# Patient Record
Sex: Female | Born: 1950 | Race: White | Hispanic: No | Marital: Married | Smoking: Former smoker
Health system: Southern US, Community
[De-identification: ages and names within clinical notes are randomized; demographics above are authoritative.]

---

## 2007-04-21 ENCOUNTER — Ambulatory Visit: Payer: Self-pay | Admitting: Unknown Physician Specialty

## 2007-09-10 LAB — HM COLONOSCOPY: HM Colonoscopy: NORMAL

## 2008-03-19 IMAGING — CR DG IVP HYPERTENSIVE
1 series · 8 of 10 positions shown · non-contrast
Comparison: none

REASON FOR EXAM: UTI  hematuria
COMMENTS:

[Series 1: view not recorded · 0.17mm/px · 8 of 11 slices shown]
[im 1/11]
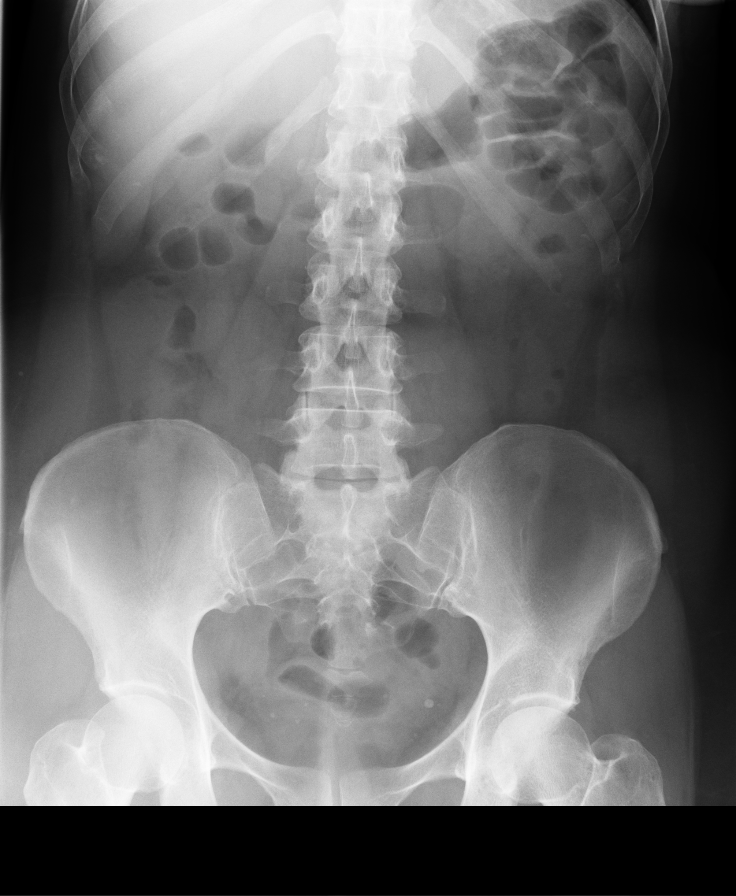
[im 2/11]
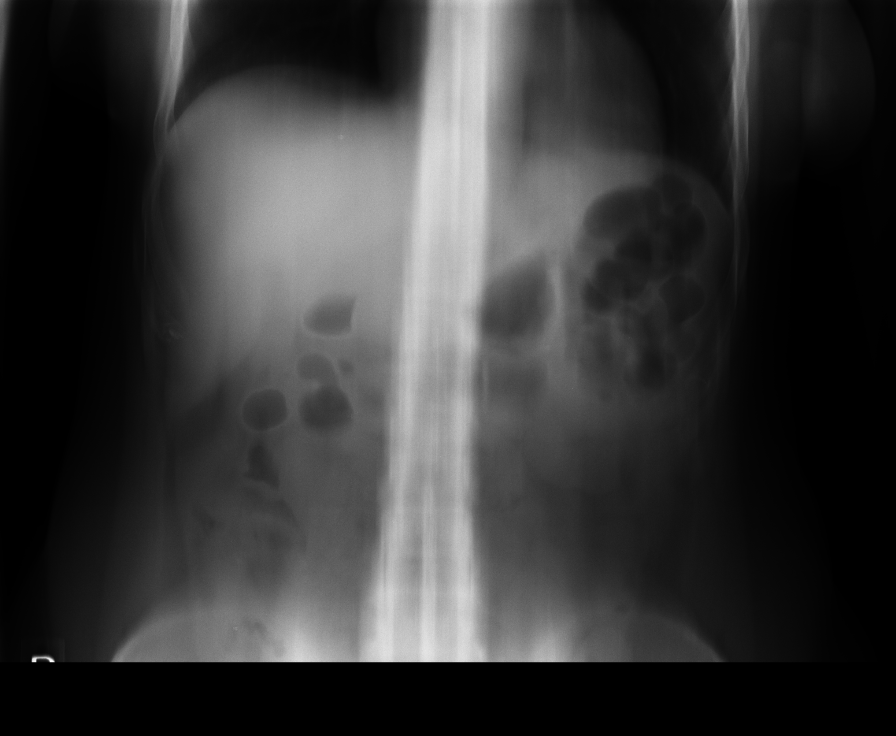
[im 3/11]
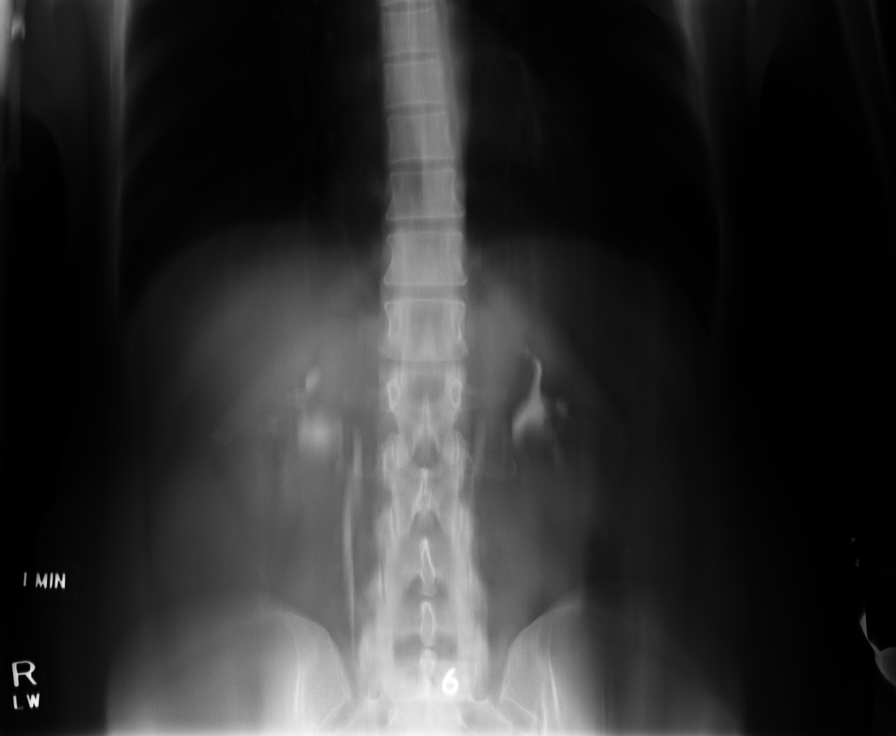
[im 4/11]
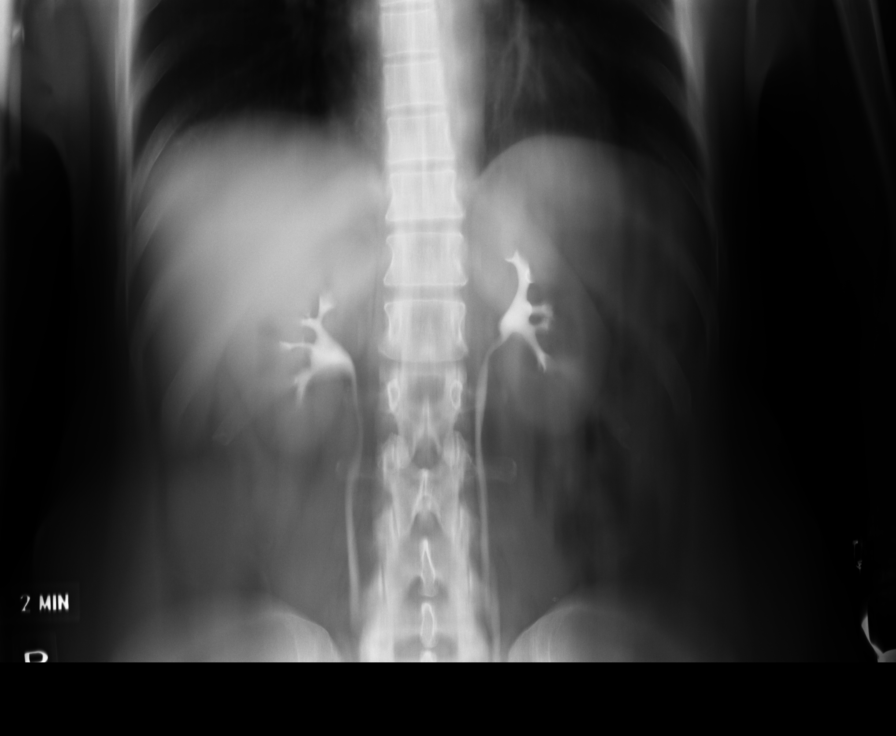
[im 5/11]
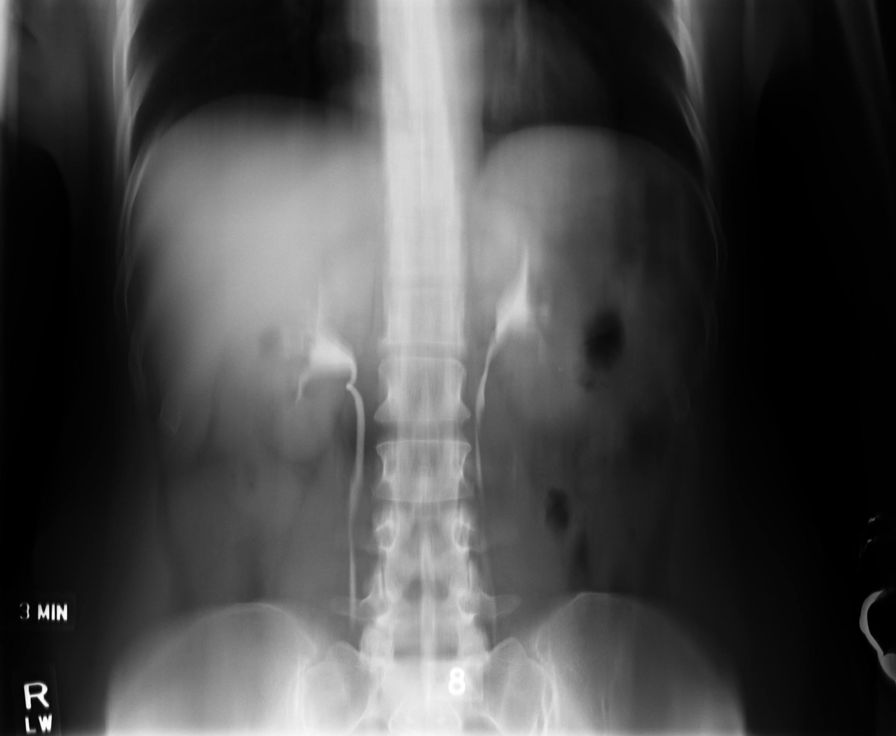
[im 6/11]
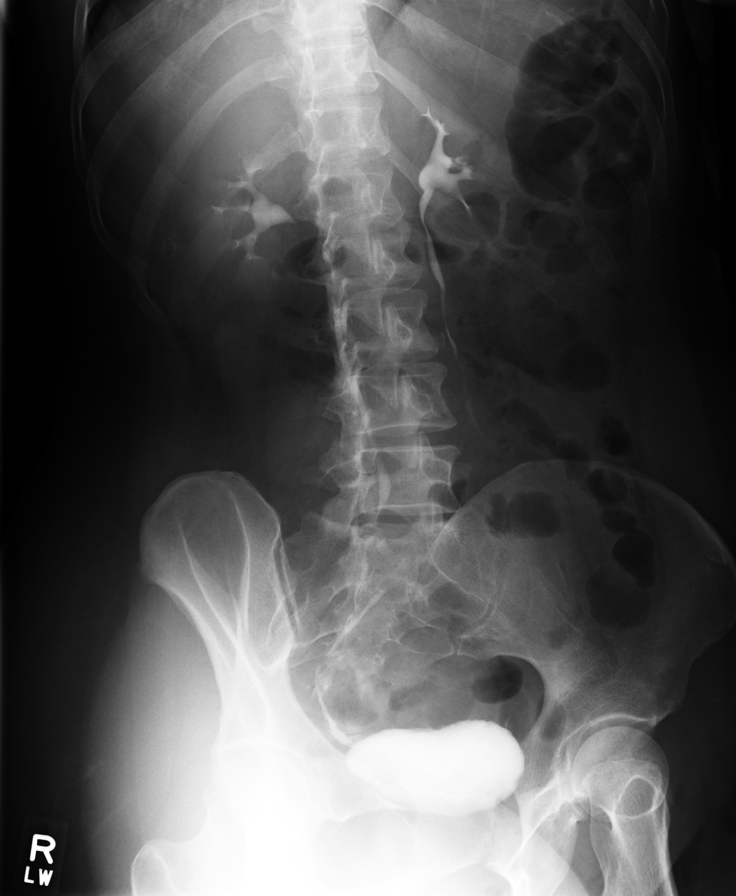
[im 7/11]
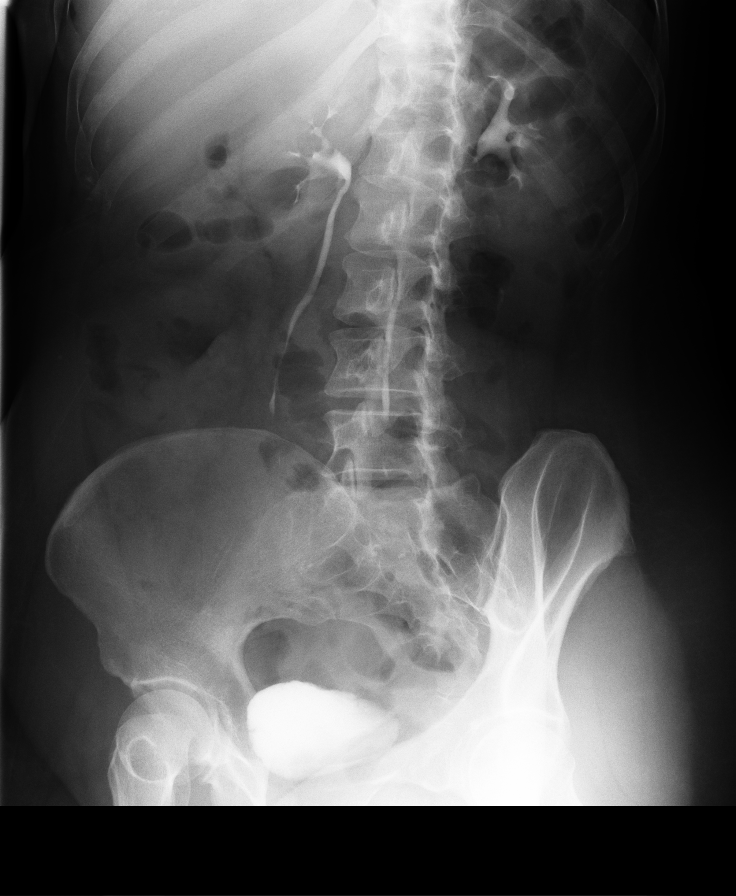
[im 8/11]
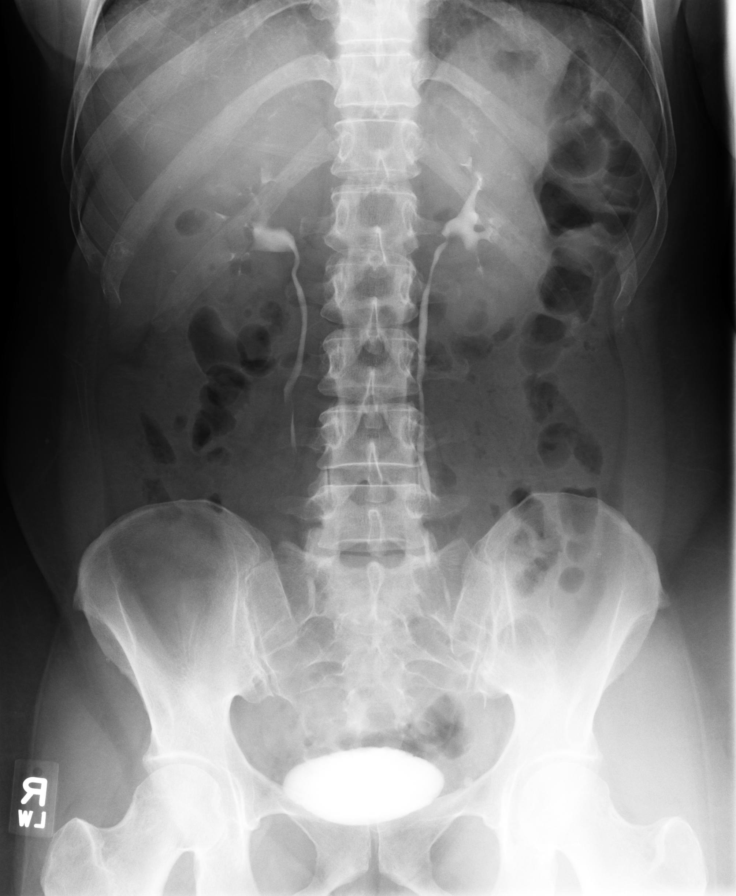

[8 of 10 positions shown; findings below may reference images not displayed]

PROCEDURE:     DXR - DXR INTRAVENOUS UROGRAPHY (IVP)  - April 21, 2007  [DATE]

RESULT:     The scout views demonstrate no definite radiopacity to suggest a
renal stone. Multiple calcific densities are seen in the pelvic region.
These have an appearance suggestive of phleboliths. The patient received 50
ml of iodinated contrast. Tomographic images show excretion of contrast on
the first image bilaterally with no obstruction evident. The kidneys are
slightly small. The LEFT kidney and RIGHT kidney show no definite defect or
extrinsic mass. No filling defect is seen in the ureter or in the renal
pelvis. There is no abnormal distention of the ureters or renal pelvis. The
bladder fills without a significant defect. A postvoid film shows no
significant postvoid residual volume.
IMPRESSION: Unremarkable IVP. No focal renal mass. No definite stones evident. No
obstructive changes present.

## 2011-10-11 LAB — HM DEXA SCAN

## 2012-06-09 ENCOUNTER — Encounter: Payer: Self-pay | Admitting: Internal Medicine

## 2012-06-09 ENCOUNTER — Ambulatory Visit (INDEPENDENT_AMBULATORY_CARE_PROVIDER_SITE_OTHER): Payer: BC Managed Care – PPO | Admitting: Internal Medicine

## 2012-06-09 VITALS — BP 124/72 | HR 74 | Temp 98.1°F | Resp 16 | Ht 62.0 in | Wt 140.8 lb

## 2012-06-09 DIAGNOSIS — E559 Vitamin D deficiency, unspecified: Secondary | ICD-10-CM | POA: Insufficient documentation

## 2012-06-09 DIAGNOSIS — M810 Age-related osteoporosis without current pathological fracture: Secondary | ICD-10-CM

## 2012-06-09 DIAGNOSIS — N895 Stricture and atresia of vagina: Secondary | ICD-10-CM

## 2012-06-09 DIAGNOSIS — E509 Vitamin A deficiency, unspecified: Secondary | ICD-10-CM | POA: Insufficient documentation

## 2012-06-09 DIAGNOSIS — E039 Hypothyroidism, unspecified: Secondary | ICD-10-CM

## 2012-06-09 DIAGNOSIS — E785 Hyperlipidemia, unspecified: Secondary | ICD-10-CM | POA: Insufficient documentation

## 2012-06-09 LAB — LIPID PANEL
HDL: 76 mg/dL (ref >39.00)
Total CHOL/HDL Ratio: 3
VLDL: 18.8 mg/dL (ref 0.0–40.0)

## 2012-06-09 LAB — COMPREHENSIVE METABOLIC PANEL
AST: 22 U/L (ref 0–37)
Albumin: 4.1 g/dL (ref 3.5–5.2)
Alkaline Phosphatase: 69 U/L (ref 39–117)
Calcium: 9.4 mg/dL (ref 8.4–10.5)
Chloride: 103 mEq/L (ref 96–112)
Glucose, Bld: 101 mg/dL — ABNORMAL HIGH (ref 70–99)
Potassium: 4 mEq/L (ref 3.5–5.1)
Sodium: 139 mEq/L (ref 135–145)
Total Protein: 7.1 g/dL (ref 6.0–8.3)

## 2012-06-09 LAB — TSH: TSH: 1.93 u[IU]/mL (ref 0.35–5.50)

## 2012-06-09 MED ORDER — NITROFURANTOIN MONOHYD MACRO 100 MG PO CAPS: ORAL_CAPSULE | ORAL | Status: DC

## 2012-06-09 MED ORDER — METH-HYO-M BL-BENZ ACD-PH SAL 81.6 MG PO TABS: ORAL_TABLET | ORAL | Status: DC

## 2012-06-09 NOTE — Assessment & Plan Note (Signed)
She is not interested in medication as treatment , but prefers to manage with exercise and calcium and vitamin d

## 2012-06-09 NOTE — Progress Notes (Signed)
Patient ID: Frances Johnson, female   DOB: 1951/02/08, 61 y.o.   MRN: [id number]  Patient Active Problem List  Diagnosis  . Osteoporosis, post-menopausal  . Hyperlipidemia LDL goal < 130  . Hypovitaminosis D  . Hypovitaminosis A  . Vaginal stenosis    Subjective:  CC:   Chief Complaint  Patient presents with  . New Patient    HPI:   Frances Johnson is a 61 y.o. female who presents as a new patient to establish primary care with the chief complaint of New patient.  She is a healthy  61 yr old white female  With a history of osteoporosis by DEXA.  She has no history of fractures and is physically active . She is a Chiropodist.  And commutes daily to [place].  She is happy in her homelife and job but is stressed  by the commute. Has to get up for 4:30 to beat the traffic, sleeps for a half hour in her car in the parking lot.  Leaves at 3:00 pm .  Cc is hoarseness:  It occurred as a result of her last major illness secondary to strep pharyngitis one year ago which was treated with prednisone. Her voice ha been hoarse since,  She has been working with Dr. Ladona Ridgel and his speech pathologisit to heal vocal folds.  The speech pathologist is [employer].   Takes a variety of vitaminss and minerals per Cleotis Lema.  Mild hyperlipidemia but HDL is very high also.  Has stopped walking daily because of extracurricular  Activities; she is in the process of house renovation with husband since February. Plans to resume it now,  30 minutes 5 times per week.  2) Dyspareunia:  Pelvic exam  last year by [facility] was normal. Resolved with use of Replens, but after a 6 month period of Replens unavailability, when she resumed it , it stopped working.  Then tried Estring, which she discontinued last December due to concerns about exogenous estrogen.  Wants to try vaginal dilators found on website called vaginismus. Com .  Had a bladder infection while using them. Husband has a very large penis and  she has a very small vagina.  Never had a problem when they were younger,  Not a lubrication issue.  Feels it is a stenosis .  Has decided to wait until house renovation is finished, now using replens every day in preparation for the future.    History reviewed. No pertinent past medical history.  History reviewed. No pertinent past surgical history.  Family History  Problem Relation Age of Onset  . Hyperlipidemia Mother   . Heart disease Mother   . Osteoporosis Mother   . Mental illness Mother     multi infarct dementia    History   Social History  . Marital Status: Married    Spouse Name: N/A    Number of Children: N/A  . Years of Education: N/A   Occupational History  . Not on file.   Social History Main Topics  . Smoking status: Former Smoker    Types: Cigarettes    Quit date: 06/09/1982  . Smokeless tobacco: Never Used  . Alcohol Use: Yes  . Drug Use: No  . Sexually Active: Not on file   Other Topics Concern  . Not on file   Social History Narrative  . No narrative on file    Allergies  Allergen Reactions  . Keflex (Cephalexin)      Review of  Systems:   Review of Systems  Constitutional: Negative.  Negative for chills and malaise/fatigue.  HENT: Negative for congestion and sore throat.   Eyes: Negative.   Respiratory: Negative.  Negative for cough, shortness of breath and wheezing.   Cardiovascular: Negative for chest pain and claudication.  Gastrointestinal: Negative for heartburn and blood in stool.  Genitourinary: Negative for dysuria and frequency.  Musculoskeletal: Negative for myalgias.  Skin: Negative for rash.  Neurological: Negative for dizziness, speech change and headaches.  Psychiatric/Behavioral: Negative for depression and hallucinations. The patient is not nervous/anxious.       Objective:  BP 124/72  Pulse 74  Temp 98.1 F (36.7 C) (Oral)  Resp 16  Ht 5\' 2"  (1.575 m)  Wt 140 lb 12 oz (63.844 kg)  BMI 25.74 kg/m2  SpO2  97%  General appearance: alert, cooperative and appears stated age Ears: normal TM's and external ear canals both ears Throat: lips, mucosa, and tongue normal; teeth and gums normal Neck: no adenopathy, no carotid bruit, supple, symmetrical, trachea midline and thyroid not enlarged, symmetric, no tenderness/mass/nodules Back: symmetric, no curvature. ROM normal. No CVA tenderness. Lungs: clear to auscultation bilaterally Heart: regular rate and rhythm, S1, S2 normal, no murmur, click, rub or gallop Abdomen: soft, non-tender; bowel sounds normal; no masses,  no organomegaly Pulses: 2+ and symmetric Skin: Skin color, texture, turgor normal. No rashes or lesions Lymph nodes: Cervical, supraclavicular, and axillary nodes normal.  Assessment and Plan:  Hypovitaminosis A She has been Taking massive amounts daily of vitamin A and needs her  level checked.   Hypovitaminosis D She has been taking 2000 units daily for history of low vitamin d in the setting of osteoporosis and has had not a repeat level checked in over a year.   Hyperlipidemia LDL goal < 130 LDL is 135,  HDL 76.  There is no indication for lipids.   Osteoporosis, post-menopausal She is not interested in medication as treatment , but prefers to manage with exercise and calcium and vitamin d  Vaginal stenosis Managed with Replens and vaginal dilators per patient preference.    Updated Medication List Outpatient Encounter Prescriptions as of 06/09/2012  Medication Sig Dispense Refill  . Ascorbic Acid (VITAMIN C) 1000 MG tablet Take 1,250 mg by mouth daily.      . beta carotene 16109 UNIT capsule Take 35,000 Units by mouth daily.      Marland Kitchen BORON PO Take 3.2 mg by mouth.      . calcium carbonate 1250 MG capsule Take 1,250 mg by mouth 2 (two) times daily with a meal.      . cholecalciferol (VITAMIN D) 1000 UNITS tablet Take 1,800 Units by mouth daily.      . fish oil-omega-3 fatty acids 1000 MG capsule Take 2 g by mouth daily.       . folic acid (FOLVITE) 1 MG tablet Take 2 mg by mouth daily.      [place] Chelate-Vit D (FOSTEUM PO) Take by mouth 2 (two) times daily.      . Glucosamine-Chondroit-Vit C-Mn (GLUCOSAMINE 1500 COMPLEX PO) Take by mouth.      . levothyroxine (SYNTHROID, LEVOTHROID) 88 MCG tablet Take 88 mcg by mouth daily.      . magnesium oxide (MAG-OX) 400 MG tablet Take 800 mg by mouth daily.      Marland Kitchen MANGANESE PO Take 17 mg by mouth.      . Meth-Hyo-M Bl-Benz Acd-Ph Sal (HYOPHEN PO) Take by mouth.      Marland Kitchen  Meth-Hyo-M Bl-Benz Acd-Ph Sal 81.6 MG TABS Take as needed for urinary complaints  120 each  0  . niacin 500 MG tablet Take 500 mg by mouth daily with breakfast.      . nitrofurantoin, macrocrystal-monohydrate, (MACROBID) 100 MG capsule A capsule after intercourse Keep on file for future refills  30 capsule  1  . Selenium 200 MCG CAPS Take by mouth.      . vitamin E 100 UNIT capsule Take 800 Units by mouth daily.      . vitamin k 100 MCG tablet Take 500 mcg by mouth daily.      . Zinc 50 MG TABS Take 65 mg by mouth.         Orders Placed This Encounter  Procedures  . HM MAMMOGRAPHY  . HM DEXA SCAN  . HM PAP SMEAR  . Lipid panel  . TSH  . Comprehensive metabolic panel  . Vitamin D (25 hydroxy)  . Vitamin A  . LDL cholesterol, direct    No Follow-up on file.

## 2012-06-09 NOTE — Assessment & Plan Note (Signed)
Managed with Replens and vaginal dilators per patient preference.

## 2012-06-09 NOTE — Assessment & Plan Note (Addendum)
She has been Taking massive amounts daily of vitamin A and needs her  level checked.

## 2012-06-09 NOTE — Assessment & Plan Note (Addendum)
LDL is 135,  HDL 76.  There is no indication for lipids.

## 2012-06-09 NOTE — Assessment & Plan Note (Signed)
She has been taking 2000 units daily for history of low vitamin d in the setting of osteoporosis and has had not a repeat level checked in over a year.

## 2012-06-11 ENCOUNTER — Telehealth: Payer: Self-pay | Admitting: Internal Medicine

## 2012-06-11 DIAGNOSIS — E039 Hypothyroidism, unspecified: Secondary | ICD-10-CM

## 2012-06-11 MED ORDER — LEVOTHYROXINE SODIUM 88 MCG PO TABS: 88.0000 ug | ORAL_TABLET | Freq: Every day | ORAL | Status: DC

## 2012-06-11 NOTE — Telephone Encounter (Signed)
rx has been called in

## 2012-06-11 NOTE — Telephone Encounter (Signed)
Patient states that she needs [facility]rox[facility] [place], she states that she spoke with you about her lab results yesterday and you were going to call this in.  Please make sure this gets called in today.

## 2012-06-13 ENCOUNTER — Other Ambulatory Visit (INDEPENDENT_AMBULATORY_CARE_PROVIDER_SITE_OTHER): Payer: BC Managed Care – PPO | Admitting: *Deleted

## 2012-06-13 DIAGNOSIS — E509 Vitamin A deficiency, unspecified: Secondary | ICD-10-CM

## 2012-06-14 ENCOUNTER — Encounter: Payer: Self-pay | Admitting: Internal Medicine

## 2012-09-09 ENCOUNTER — Other Ambulatory Visit: Payer: Self-pay

## 2012-09-09 ENCOUNTER — Other Ambulatory Visit (HOSPITAL_COMMUNITY)
Admission: RE | Admit: 2012-09-09 | Discharge: 2012-09-09 | Disposition: A | Discharge status: Home / Self Care | Payer: BC Managed Care – PPO | Source: Ambulatory Visit | Attending: Internal Medicine | Admitting: Internal Medicine

## 2012-09-09 ENCOUNTER — Ambulatory Visit (INDEPENDENT_AMBULATORY_CARE_PROVIDER_SITE_OTHER): Payer: BC Managed Care – PPO | Admitting: Internal Medicine

## 2012-09-09 ENCOUNTER — Encounter: Payer: Self-pay | Admitting: Internal Medicine

## 2012-09-09 VITALS — BP 130/72 | HR 80 | Temp 98.9°F | Ht 62.0 in | Wt 140.8 lb

## 2012-09-09 DIAGNOSIS — Z1151 Encounter for screening for human papillomavirus (HPV): Secondary | ICD-10-CM | POA: Insufficient documentation

## 2012-09-09 DIAGNOSIS — Z01419 Encounter for gynecological examination (general) (routine) without abnormal findings: Secondary | ICD-10-CM | POA: Insufficient documentation

## 2012-09-09 DIAGNOSIS — M810 Age-related osteoporosis without current pathological fracture: Secondary | ICD-10-CM

## 2012-09-09 DIAGNOSIS — E559 Vitamin D deficiency, unspecified: Secondary | ICD-10-CM

## 2012-09-09 DIAGNOSIS — Z124 Encounter for screening for malignant neoplasm of cervix: Secondary | ICD-10-CM

## 2012-09-09 DIAGNOSIS — Z1239 Encounter for other screening for malignant neoplasm of breast: Secondary | ICD-10-CM

## 2012-09-09 MED ORDER — NITROFURANTOIN MONOHYD MACRO 100 MG PO CAPS: ORAL_CAPSULE | ORAL | Status: AC

## 2012-09-09 MED ORDER — FOSTEUM 27-20-200 MG-MG-UNIT PO CAPS: ORAL_CAPSULE | ORAL | Status: AC

## 2012-09-10 NOTE — Progress Notes (Signed)
Patient ID: Frances Johnson, female   DOB: 11-06-1951, 61 y.o.   MRN: [id number] . Subjective:     Frances Johnson is a 61 y.o. female and is here for a comprehensive physical exam. The patient reports no problems.  History   Social History  . Marital Status: Married    Spouse Name: N/A    Number of Children: N/A  . Years of Education: N/A   Occupational History  . Not on file.   Social History Main Topics  . Smoking status: Former Smoker    Types: Cigarettes    Quit date: 06/09/1982  . Smokeless tobacco: Never Used  . Alcohol Use: Yes  . Drug Use: No  . Sexually Active: Not on file   Other Topics Concern  . Not on file   Social History Narrative  . No narrative on file   Health Maintenance  Topic Date Due  . Tetanus/tdap  02/01/1972  . Pap Smear  06/09/2013  . Influenza Vaccine  07/20/2013  . Mammogram  10/10/2013  . Colonoscopy  09/09/2017    The following portions of the patient's history were reviewed and updated as appropriate: allergies, current medications, past family history, past medical history, past social history, past surgical history and problem list.  Review of Systems A comprehensive review of systems was negative.   Objective:     BP 130/72  Pulse 80  Temp 98.9 F (37.2 C) (Oral)  Ht 5\' 2"  (1.575 m)  Wt 140 lb 12 oz (63.844 kg)  BMI 25.74 kg/m2  SpO2 98%  General Appearance:    Alert, cooperative, no distress, appears stated age  Head:    Normocephalic, without obvious abnormality, atraumatic  Eyes:    PERRL, conjunctiva/corneas clear, EOM's intact, fundi    benign, both eyes  Ears:    Normal TM's and external ear canals, both ears  Nose:   Nares normal, septum midline, mucosa normal, no drainage    or sinus tenderness  Throat:   Lips, mucosa, and tongue normal; teeth and gums normal  Neck:   Supple, symmetrical, trachea midline, no adenopathy;    thyroid:  no enlargement/tenderness/nodules; no carotid   bruit or JVD  Back:     Symmetric, no  curvature, ROM normal, no CVA tenderness  Lungs:     Clear to auscultation bilaterally, respirations unlabored  Chest Wall:    No tenderness or deformity   Heart:    Regular rate and rhythm, S1 and S2 normal, no murmur, rub   or gallop  Breast Exam:    No tenderness, masses, or nipple abnormality  Abdomen:     Soft, non-tender, bowel sounds active all four quadrants,    no masses, no organomegaly  Genitalia:    Normal female without lesion, discharge or tenderness     Extremities:   Extremities normal, atraumatic, no cyanosis or edema  Pulses:   2+ and symmetric all extremities  Skin:   Skin color, texture, turgor normal, no rashes or lesions  Lymph nodes:   Cervical, supraclavicular, and axillary nodes normal  Neurologic:   CNII-XII intact, normal strength, sensation and reflexes    throughout     Assessment:    Healthy female exam.   Plan:     See After Visit Summary for Counseling Recommendations

## 2012-09-16 ENCOUNTER — Other Ambulatory Visit: Payer: BC Managed Care – PPO

## 2012-10-20 ENCOUNTER — Encounter: Payer: Self-pay | Admitting: Internal Medicine

## 2013-01-26 ENCOUNTER — Other Ambulatory Visit: Payer: Self-pay | Admitting: Internal Medicine

## 2013-04-01 ENCOUNTER — Telehealth: Payer: Self-pay | Admitting: Internal Medicine

## 2013-04-01 NOTE — Telephone Encounter (Signed)
Left message ask patient to return call.

## 2013-04-01 NOTE — Telephone Encounter (Signed)
Patient has trigger thumb and wants to know if Dr. Darrick Huntsman can inject her finger with cortisone or does she need to see and orthopedic specialist.

## 2013-04-01 NOTE — Telephone Encounter (Signed)
Spoke with patient and in formed patient Dr. Darrick Huntsman not in this week and offered appointment with another provider, but patient refused stating she would like to wait until Dr. Darrick Huntsman is in and have her evaluate. Will call on the 21ST.

## 2013-04-07 ENCOUNTER — Telehealth: Payer: Self-pay | Admitting: *Deleted

## 2013-04-07 NOTE — Telephone Encounter (Signed)
Patient states she has trigger thumb and would like to know if you would give cortisone injection in joint. Please advise?

## 2013-04-07 NOTE — Telephone Encounter (Signed)
No, I 'm sorry,  She would be my first and it would not be good.  But i can refer her to an orthopedist , or we can see if Dr. Dallas Schimke does trigger finger injections.

## 2013-04-08 NOTE — Telephone Encounter (Signed)
Patient made an appointment with Dr. Katrinka Blazing hand surgeon.

## 2013-05-19 ENCOUNTER — Telehealth: Payer: Self-pay | Admitting: Internal Medicine

## 2013-05-19 ENCOUNTER — Other Ambulatory Visit: Payer: Self-pay | Admitting: Internal Medicine

## 2013-05-19 NOTE — Telephone Encounter (Signed)
Pt states she spoke with her pharmacy and levothyroxine needs authorization.  Pt states she has left a msg on the vm and just wants to make sure the msg was received.  [facility] [address].

## 2013-05-20 ENCOUNTER — Telehealth: Payer: Self-pay | Admitting: *Deleted

## 2013-05-20 MED ORDER — LEVOTHYROXINE SODIUM 88 MCG PO TABS: ORAL_TABLET | ORAL | Status: DC

## 2013-05-20 NOTE — Addendum Note (Signed)
Addended by: Dennie Bible on: 05/20/2013 03:19 PM   Modules accepted: Orders

## 2013-05-20 NOTE — Telephone Encounter (Signed)
No OV 10/13 and no labs needs refill on levothyroxine please advise.

## 2013-06-17 ENCOUNTER — Telehealth: Payer: Self-pay | Admitting: *Deleted

## 2013-06-17 ENCOUNTER — Ambulatory Visit (INDEPENDENT_AMBULATORY_CARE_PROVIDER_SITE_OTHER): Payer: BC Managed Care – PPO | Admitting: Internal Medicine

## 2013-06-17 ENCOUNTER — Encounter: Payer: Self-pay | Admitting: Internal Medicine

## 2013-06-17 VITALS — BP 128/72 | HR 90 | Temp 98.1°F | Resp 14 | Ht 61.0 in | Wt 138.2 lb

## 2013-06-17 DIAGNOSIS — Z23 Encounter for immunization: Secondary | ICD-10-CM

## 2013-06-17 DIAGNOSIS — N895 Stricture and atresia of vagina: Secondary | ICD-10-CM

## 2013-06-17 DIAGNOSIS — E039 Hypothyroidism, unspecified: Secondary | ICD-10-CM

## 2013-06-17 DIAGNOSIS — Z13220 Encounter for screening for lipoid disorders: Secondary | ICD-10-CM

## 2013-06-17 DIAGNOSIS — Z0000 Encounter for general adult medical examination without abnormal findings: Secondary | ICD-10-CM

## 2013-06-17 DIAGNOSIS — R5381 Other malaise: Secondary | ICD-10-CM

## 2013-06-17 DIAGNOSIS — M810 Age-related osteoporosis without current pathological fracture: Secondary | ICD-10-CM

## 2013-06-17 MED ORDER — ASPIRIN EC 81 MG PO TBEC: 81.0000 mg | DELAYED_RELEASE_TABLET | Freq: Every day | ORAL | Status: AC

## 2013-06-17 NOTE — Patient Instructions (Addendum)
I am sending you home with sterile containers to collect your urine the next time you have symptoms of a UTI (before you start the macrobid)   Referral to Dr Lavenia Atlas for  DEXA scan and management of osteoporosis   You received the Shingles vaccine today  Mammogram to be set up   Labs

## 2013-06-17 NOTE — Telephone Encounter (Signed)
Pt would like for now on her labs be mailed to her

## 2013-06-17 NOTE — Assessment & Plan Note (Addendum)
Bone density test ordered.  Screening intervals discussed. Will only consider Reclast or Prolia,  No oral medications due to awareness of side effects.

## 2013-06-17 NOTE — Progress Notes (Signed)
Patient ID: Frances Johnson, female   DOB: 1951-11-05, 62 y.o.   MRN: [id number]  Patient Active Problem List   Diagnosis Date Noted  . Routine general medical examination at a health care facility 06/19/2013  . Osteoporosis, post-menopausal 06/09/2012  . Hyperlipidemia LDL goal < 130 06/09/2012  . Hypovitaminosis D 06/09/2012  . Hypovitaminosis A 06/09/2012  . Vaginal stenosis 06/09/2012    Subjective:  CC:   Chief Complaint  Patient presents with  . Annual Exam    HPI:   Frances Johnson a 62 y.o. female who presents for routine checkup. She is requesting to have another physical and early mammogram to have everything done at the same time  Strong FH of osteoporosis,   Bone density has been decreasing  By last DEXA scan but less so since she started taking Fosteum prescribed initially by [facility].  Will not take oral bisphosphonates due to concerns for side effects   Hypothyroid. Needs thyroid rechecked.   Cystitis.  Needs refill on hyophen ,  Uses it prn for dysuria,  30 tablets last a year,  Has macrobid refills .  Trigger thumb: Had 2 cortisone injecxtions in trigger thumb by Dr Reita Chard recently  Hoarseness: Finished Seeing speech pathology for hoarseness  Now discharged  Recent visual changges.  Saw optho for evaluation of recent experience of flashing phenomenom lateral field of left eye , no  vitreal tear or detachment.  Optho is Dr.Canting in [place]   Vaginismus:  Still using vaginal dilators along with replens, but her husband has been "too stressed out" to be interested in sex" for the last several months so she has decided to stop using them for now since the Dilator use results in cystitis. Discussed the need to confirm UTIs  When she is having symptoms because she may just be having vaginal irritation fro atrophic vaginitis.   Taking CoQ 10 now .     History reviewed. No pertinent past medical history.  History reviewed. No pertinent past surgical  history.     The following portions of the patient's history were reviewed and updated as appropriate: Allergies, current medications, and problem list.    Review of Systems:   12 Pt  review of systems was negative except those addressed in the HPI,     History   Social History  . Marital Status: Married    Spouse Name: N/A    Number of Children: N/A  . Years of Education: N/A   Occupational History  . Not on file.   Social History Main Topics  . Smoking status: Former Smoker    Types: Cigarettes    Quit date: 06/09/1982  . Smokeless tobacco: Never Used  . Alcohol Use: Yes  . Drug Use: No  . Sexually Active: Not on file   Other Topics Concern  . Not on file   Social History Narrative  . No narrative on file    Objective:  BP 128/72  Pulse 90  Temp(Src) 98.1 F (36.7 C) (Oral)  Resp 14  Ht 5\' 1"  (1.549 m)  Wt 138 lb 4 oz (62.71 kg)  BMI 26.14 kg/m2  SpO2 99%  General appearance: alert, cooperative and appears stated age Ears: normal TM's and external ear canals both ears Throat: lips, mucosa, and tongue normal; teeth and gums normal Neck: no adenopathy, no carotid bruit, supple, symmetrical, trachea midline and thyroid not enlarged, symmetric, no tenderness/mass/nodules Back: symmetric, no curvature. ROM normal. No CVA tenderness. Lungs: clear to auscultation  bilaterally Heart: regular rate and rhythm, S1, S2 normal, no murmur, click, rub or gallop Abdomen: soft, non-tender; bowel sounds normal; no masses,  no organomegaly Pulses: 2+ and symmetric Skin: Skin color, texture, turgor normal. No rashes or lesions Lymph nodes: Cervical, supraclavicular, and axillary nodes normal.  Assessment and Plan:  Osteoporosis, post-menopausal Bone density test ordered.  Screening intervals discussed. Will only consider Reclast or Prolia,  No oral medications due to awareness of side effects.    Vaginal stenosis Discussed useof dilators and recurrent symptoms of  cystitis.  She will collect a urine prior to next self medication for UTI to confirm infection .   Unspecified hypothyroidism TSH normal 1.83,  No changes to dose.     Updated Medication List Outpatient Encounter Prescriptions as of 06/17/2013  Medication Sig Dispense Refill  . Ascorbic Acid (VITAMIN C) 1000 MG tablet Take 1,250 mg by mouth daily.      . beta carotene 54098 UNIT capsule Take 35,000 Units by mouth daily.      Marland Kitchen BORON PO Take 3.2 mg by mouth.      . calcium carbonate 1250 MG capsule Take 1,250 mg by mouth 2 (two) times daily with a meal.      . cholecalciferol (VITAMIN D) 1000 UNITS tablet Take 1,800 Units by mouth daily.      . Coenzyme Q10 100 MG TABS Take 1 tablet by mouth daily.      . fish oil-omega-3 fatty acids 1000 MG capsule Take 2 g by mouth daily.      . folic acid (FOLVITE) 1 MG tablet Take 2 mg by mouth daily.      [place] Chelate-Vit D (FOSTEUM) 27-20-200 MG-MG-UNIT CAPS 1 capsule twice daily  180 each  3  . Glucosamine-Chondroit-Vit C-Mn (GLUCOSAMINE 1500 COMPLEX PO) Take by mouth.      . levothyroxine (SYNTHROID, LEVOTHROID) 88 MCG tablet TAKE 1 TABLET (88 MCG TOTAL) BY MOUTH DAILY.  30 tablet  1  . magnesium oxide (MAG-OX) 400 MG tablet Take 800 mg by mouth daily.      Marland Kitchen MANGANESE PO Take 17 mg by mouth.      . Meth-Hyo-M Bl-Benz Acd-Ph Sal (HYOPHEN PO) Take by mouth.      . Meth-Hyo-M Bl-Benz Acd-Ph Sal 81.6 MG TABS Take as needed for urinary complaints  120 each  0  . niacin 500 MG tablet Take 500 mg by mouth daily with breakfast.      . Selenium 200 MCG CAPS Take by mouth.      . vitamin E 100 UNIT capsule Take 800 Units by mouth daily.      . vitamin k 100 MCG tablet Take 500 mcg by mouth daily.      . Zinc 50 MG TABS Take 65 mg by mouth.      Marland Kitchen aspirin EC 81 MG tablet Take 1 tablet (81 mg total) by mouth daily.  30 tablet  11  . nitrofurantoin, macrocrystal-monohydrate, (MACROBID) 100 MG capsule A capsule after intercourse Keep on file for  future refills  30 capsule  3   No facility-administered encounter medications on file as of 06/17/2013.

## 2013-06-18 LAB — COMPREHENSIVE METABOLIC PANEL
ALT: 21 U/L (ref 0–35)
Alkaline Phosphatase: 67 U/L (ref 39–117)
CO2: 24 mEq/L (ref 19–32)
Creatinine, Ser: 0.6 mg/dL (ref 0.4–1.2)
GFR: 106.2 mL/min (ref >60.00)
Sodium: 138 mEq/L (ref 135–145)
Total Bilirubin: 0.6 mg/dL (ref 0.3–1.2)

## 2013-06-18 LAB — LIPID PANEL
HDL: 66.4 mg/dL (ref >39.00)
Triglycerides: 91 mg/dL (ref 0.0–149.0)
VLDL: 18.2 mg/dL (ref 0.0–40.0)

## 2013-06-18 LAB — CBC WITH DIFFERENTIAL/PLATELET
Lymphocytes Relative: 39.6 % (ref 12.0–46.0)
MCHC: 33.2 g/dL (ref 30.0–36.0)
MCV: 85.5 fl (ref 78.0–100.0)
Neutrophils Relative %: 44.6 % (ref 43.0–77.0)
Platelets: 377 10*3/uL (ref 150.0–400.0)
WBC: 6.4 10*3/uL (ref 4.5–10.5)

## 2013-06-18 LAB — TSH: TSH: 1.85 u[IU]/mL (ref 0.35–5.50)

## 2013-06-19 ENCOUNTER — Encounter: Payer: Self-pay | Admitting: Internal Medicine

## 2013-06-19 DIAGNOSIS — E039 Hypothyroidism, unspecified: Secondary | ICD-10-CM | POA: Insufficient documentation

## 2013-06-19 DIAGNOSIS — Z0000 Encounter for general adult medical examination without abnormal findings: Secondary | ICD-10-CM | POA: Insufficient documentation

## 2013-06-19 MED ORDER — LEVOTHYROXINE SODIUM 88 MCG PO TABS: ORAL_TABLET | ORAL | Status: AC

## 2013-06-19 NOTE — Addendum Note (Signed)
Addended by: Sherlene Shams on: 06/19/2013 01:07 PM   Modules accepted: Orders

## 2013-06-19 NOTE — Assessment & Plan Note (Signed)
TSH normal 1.83,  No changes to dose.

## 2013-06-19 NOTE — Assessment & Plan Note (Signed)
Discussed useof dilators and recurrent symptoms of cystitis.  She will collect a urine prior to next self medication for UTI to confirm infection .

## 2013-06-20 NOTE — Telephone Encounter (Signed)
Updated FYI

## 2013-06-22 ENCOUNTER — Other Ambulatory Visit: Payer: Self-pay | Admitting: Internal Medicine

## 2013-06-23 ENCOUNTER — Encounter: Payer: Self-pay | Admitting: Emergency Medicine

## 2013-06-26 ENCOUNTER — Telehealth: Payer: Self-pay | Admitting: Internal Medicine

## 2013-06-26 NOTE — Telephone Encounter (Signed)
Pt was concerned about her dexa scan. Please call her with the results of this.

## 2013-06-30 ENCOUNTER — Telehealth: Payer: Self-pay | Admitting: Internal Medicine

## 2013-06-30 DIAGNOSIS — M810 Age-related osteoporosis without current pathological fracture: Secondary | ICD-10-CM

## 2013-06-30 NOTE — Telephone Encounter (Signed)
Her dexa shows osteoporosis in the spine only.  i recommend referral to Frances Johnson to discuss treatment

## 2013-07-08 NOTE — Telephone Encounter (Signed)
Patient notified as instructed. 

## 2013-07-08 NOTE — Telephone Encounter (Signed)
Patient notified

## 2013-07-08 NOTE — Telephone Encounter (Signed)
Labs mailed today

## 2013-07-13 ENCOUNTER — Encounter: Payer: Self-pay | Admitting: Rheumatology

## 2013-08-27 ENCOUNTER — Telehealth: Payer: Self-pay | Admitting: Internal Medicine

## 2013-08-27 DIAGNOSIS — Z113 Encounter for screening for infections with a predominantly sexual mode of transmission: Secondary | ICD-10-CM

## 2013-08-27 NOTE — Telephone Encounter (Signed)
Please advise as to labs can schedule lab appointment.

## 2013-08-27 NOTE — Telephone Encounter (Signed)
Lab scheduled tomorrow.

## 2013-08-27 NOTE — Telephone Encounter (Signed)
Pt states she was told last night by her spouse that he has been sexually active with another woman, who is also sexually active with other men.  Pt would like to come in for blood work today to be checked for STDs.  Pt would prefer not to be seen if she can just get the blood work.  Pt is off work today and would like to have the blood work today if possible.  Is willing to be seen by Dr. If necessary.  Please contact pt if she can come in for blood work today.  She is very upset.

## 2013-08-27 NOTE — Telephone Encounter (Signed)
Patient stated she has scheduled appointment to see you on Monday would you like to get labs prior patient would like to know so she can have results as soon as possible. Please advise.

## 2013-08-27 NOTE — Telephone Encounter (Signed)
Yes she can ,  i have ordered all blood and urine collectible tests for STDS.

## 2013-08-28 ENCOUNTER — Other Ambulatory Visit (INDEPENDENT_AMBULATORY_CARE_PROVIDER_SITE_OTHER): Payer: BC Managed Care – PPO

## 2013-08-28 DIAGNOSIS — Z113 Encounter for screening for infections with a predominantly sexual mode of transmission: Secondary | ICD-10-CM

## 2013-08-29 LAB — GC/CHLAMYDIA PROBE AMP, URINE
Chlamydia, Swab/Urine, PCR: NEGATIVE
GC Probe Amp, Urine: NEGATIVE

## 2013-08-29 LAB — HEPATITIS C ANTIBODY: HCV Ab: NEGATIVE

## 2013-08-29 LAB — RPR

## 2013-08-31 ENCOUNTER — Ambulatory Visit (INDEPENDENT_AMBULATORY_CARE_PROVIDER_SITE_OTHER): Payer: BC Managed Care – PPO | Admitting: Internal Medicine

## 2013-08-31 VITALS — BP 130/82 | HR 99 | Temp 98.9°F | Wt 132.0 lb

## 2013-08-31 DIAGNOSIS — Z113 Encounter for screening for infections with a predominantly sexual mode of transmission: Secondary | ICD-10-CM

## 2013-08-31 LAB — HSV(HERPES SIMPLEX VRS) I + II AB-IGG: HSV 1 Glycoprotein G Ab, IgG: 0.1 IV

## 2013-09-01 ENCOUNTER — Encounter: Payer: Self-pay | Admitting: Internal Medicine

## 2013-09-01 DIAGNOSIS — Z113 Encounter for screening for infections with a predominantly sexual mode of transmission: Secondary | ICD-10-CM | POA: Insufficient documentation

## 2013-09-01 NOTE — Progress Notes (Signed)
Patient ID: Frances Johnson, female   DOB: [birth year], 62 y.o.   MRN: [id number]

## 2013-09-01 NOTE — Assessment & Plan Note (Signed)
I spent 30 minutes counseling patient on her recent divorce and exposure to STDs. 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 Discussed the significance of this positive antibody. She states that several times a year she develops 1 boil on her bulb a is not particularly painful but may be a sign that she has had herpes flares in the past .  she's concerned about how to determine for sure if this is herpes blister when she has it. She does not want to have to discuss this with any future progress if it is not indeed a herpes flare. I have recommended that the next time she has the appearance of this lesion on her vulva that she make an appointment for chronic pelvic exam and we will do a Tzanck smear.

## 2013-09-01 NOTE — Progress Notes (Signed)
Patient ID: Frances Johnson, female   DOB: 06/17/1951, 62 y.o.   MRN: [id number]  Patient Active Problem List   Diagnosis Date Noted  . Screening for STD (sexually transmitted disease) 09/01/2013  . Routine general medical examination at a health care facility 06/19/2013  . Unspecified hypothyroidism 06/19/2013  . Osteoporosis, post-menopausal 06/09/2012  . Hyperlipidemia LDL goal < 130 06/09/2012  . Hypovitaminosis D 06/09/2012  . Hypovitaminosis A 06/09/2012  . Vaginal stenosis 06/09/2012    Subjective:  CC:   Chief Complaint  Patient presents with  . Follow-up    Discuss the results of testing she had done. Not sleeping very well and has tried [employer] and it is not working. Also thinks she may have poison ivy    HPI:   Frances Johnson a 62 y.o. female who presents for followup discussion of labs drawn at patient's request due to discovery that her husband has been having an affair. Patient is brought in today to discuss recent serologic and urologic screening for STDs. During the interim she and her husband have decided to divorce after he told her that he has never loved her. He is considerably younger than her and she has taken steps to protect herself financially. She has no symptoms of communicable diseases. She is grieving the loss of her marriage. She has already joined several support groups for divorce these and this is supported not many family members and friends. She is reacting appropriately but is aggressively making plans to move on including relocating to [facility] as she has been commuting considerable distance to to work daily for years accommodate his request to liver closer to his mother .     No past medical history on file.  No past surgical history on file.     The following portions of the patient's history were reviewed and updated as appropriate: Allergies, current medications, and problem list.    Review of Systems:  Patient denies headache, fevers, malaise,  unintentional weight loss, skin rash, eye pain, sinus congestion and sinus pain, sore throat, dysphagia,  hemoptysis , cough, dyspnea, wheezing, chest pain, palpitations, orthopnea, edema, abdominal pain, nausea, melena, diarrhea, constipation, flank pain, dysuria, hematuria, urinary  Frequency, nocturia, numbness, tingling, seizures,  Focal weakness, Loss of consciousness,  Tremor, insomnia, depression, anxiety, and suicidal ideation.      History   Social History  . Marital Status: Recently separatated    Spouse Name: N/A    Number of Children: N/A  . Years of Education: N/A   Occupational History  . Not on file.   Social History Main Topics  . Smoking status: Former Smoker    Types: Cigarettes    Quit date: 06/09/1982  . Smokeless tobacco: Never Used  . Alcohol Use: Yes  . Drug Use: No  . Sexual Activity: Not on file   Other Topics Concern  . Not on file   Social History Narrative  . No narrative on file    Objective:  Filed Vitals:   08/31/13 1542  BP: 130/82  Pulse: 99  Temp: 98.9 F (37.2 C)     General appearance: alert, cooperative and appears stated age Ears: normal TM's and external ear canals both ears Throat: lips, mucosa, and tongue normal; teeth and gums normal Neck: no adenopathy, no carotid bruit, supple, symmetrical, trachea midline and thyroid not enlarged, symmetric, no tenderness/mass/nodules Back: symmetric, no curvature. ROM normal. No CVA tenderness. Lungs: clear to auscultation bilaterally Heart: regular rate and rhythm, S1, S2 normal, no  murmur, click, rub or gallop Abdomen: soft, non-tender; bowel sounds normal; no masses,  no organomegaly Pulses: 2+ and symmetric Skin: Skin color, texture, turgor normal. No rashes or lesions Lymph nodes: Cervical, supraclavicular, and axillary nodes normal.  Assessment and Plan:  Screening for STD (sexually transmitted disease) I spent 40 minutes counseling patient on her recent divorce and exposure  to STDs. 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 Discussed the significance of this positive antibody. She states that several times a year she develops 1 boil on her bulb a is not particularly painful but may be a sign that she has had herpes flares in the past .  she's concerned about how to determine for sure if this is herpes blister when she has it. She does not want to have to discuss this with any future progress if it is not indeed a herpes flare. I have recommended that the next time she has the appearance of this lesion on her vulva that she make an appointment for chronic pelvic exam and we will do a T[place]ck smear.  Although she is not sleeping well she is adamant about not taking medications that can be habit-forming. She is using melatonin 3 mg daily and I told her is okay to increase to 6 mg daily.  A total of 40 minutes of face to face time was spent with patient more than half of which was spent in counselling and coordination of care   Updated Medication List Outpatient Encounter Prescriptions as of 08/31/2013  Medication Sig Dispense Refill  . Ascorbic Acid (VITAMIN C) 1000 MG tablet Take 1,250 mg by mouth daily.      Marland Kitchen aspirin EC 81 MG tablet Take 1 tablet (81 mg total) by mouth daily.  30 tablet  11  . beta carotene 16109 UNIT capsule Take 35,000 Units by mouth daily.      Marland Kitchen BORON PO Take 3.2 mg by mouth.      . calcium carbonate 1250 MG capsule Take 1,250 mg by mouth 2 (two) times daily with a meal.      . cholecalciferol (VITAMIN D) 1000 UNITS tablet Take 1,800 Units by mouth daily.      . Coenzyme Q10 100 MG TABS Take 1 tablet by mouth daily.      . fish oil-omega-3 fatty acids 1000 MG capsule Take 2 g by mouth daily.      . folic acid (FOLVITE) 1 MG tablet Take 2 mg by mouth daily.      [place] Chelate-Vit D (FOSTEUM) 27-20-200 MG-MG-UNIT CAPS 1 capsule twice daily  180 each  3  . Glucosamine-Chondroit-Vit C-Mn (GLUCOSAMINE  1500 COMPLEX PO) Take by mouth.      . levothyroxine (SYNTHROID, LEVOTHROID) 88 MCG tablet TAKE 1 TABLET (88 MCG TOTAL) BY MOUTH DAILY.  90 tablet  2  . magnesium oxide (MAG-OX) 400 MG tablet Take 800 mg by mouth daily.      Marland Kitchen MANGANESE PO Take 17 mg by mouth.      . Meth-Hyo-M Bl-Benz Acd-Ph Sal (HYOPHEN PO) Take by mouth.      . Meth-Hyo-M Bl-Benz Acd-Ph Sal 81.6 MG TABS TAKE AS NEEDED FOR URINARY COMPLAINTS  90 tablet  0  . niacin 500 MG tablet Take 500 mg by mouth daily with breakfast.      . nitrofurantoin, macrocrystal-monohydrate, (MACROBID) 100 MG capsule A capsule after intercourse Keep on file for future refills  30 capsule  3  . Selenium 200 MCG CAPS Take by mouth.      . vitamin E 100 UNIT capsule Take 800 Units by mouth daily.      . vitamin k 100 MCG tablet Take 500 mcg by mouth daily.      . Zinc 50 MG TABS Take 65 mg by mouth.       No facility-administered encounter medications on file as of 08/31/2013.     No orders of the defined types were placed in this encounter.    No Follow-up on file.

## 2013-09-24 ENCOUNTER — Other Ambulatory Visit: Payer: Self-pay

## 2013-10-28 ENCOUNTER — Encounter: Payer: Self-pay | Admitting: Internal Medicine

## 2022-05-31 ENCOUNTER — Telehealth: Payer: Self-pay

## 2022-05-31 NOTE — Telephone Encounter (Signed)
error
